# Patient Record
Sex: Female | Born: 1950 | Race: White | Hispanic: No | Marital: Married
Health system: Southern US, Community
[De-identification: ages and names within clinical notes are randomized; demographics above are authoritative.]

---

## 1998-03-11 ENCOUNTER — Other Ambulatory Visit: Admission: RE | Admit: 1998-03-11 | Discharge: 1998-03-11 | Payer: Self-pay | Admitting: *Deleted

## 1998-04-22 ENCOUNTER — Ambulatory Visit (HOSPITAL_COMMUNITY): Admission: RE | Admit: 1998-04-22 | Discharge: 1998-04-22 | Payer: Self-pay | Admitting: *Deleted

## 1999-05-01 ENCOUNTER — Other Ambulatory Visit: Admission: RE | Admit: 1999-05-01 | Discharge: 1999-05-01 | Payer: Self-pay | Admitting: Obstetrics & Gynecology

## 2000-04-27 ENCOUNTER — Other Ambulatory Visit: Admission: RE | Admit: 2000-04-27 | Discharge: 2000-04-27 | Payer: Self-pay | Admitting: Obstetrics & Gynecology

## 2000-04-28 ENCOUNTER — Other Ambulatory Visit: Admission: RE | Admit: 2000-04-28 | Discharge: 2000-04-28 | Payer: Self-pay | Admitting: Obstetrics & Gynecology

## 2002-01-09 ENCOUNTER — Ambulatory Visit (HOSPITAL_COMMUNITY): Admission: RE | Admit: 2002-01-09 | Discharge: 2002-01-09 | Payer: Self-pay | Admitting: Family Medicine

## 2002-01-09 ENCOUNTER — Encounter: Payer: Self-pay | Admitting: Family Medicine

## 2002-05-04 ENCOUNTER — Other Ambulatory Visit: Admission: RE | Admit: 2002-05-04 | Discharge: 2002-05-04 | Payer: Self-pay | Admitting: Obstetrics & Gynecology

## 2002-05-16 ENCOUNTER — Ambulatory Visit (HOSPITAL_COMMUNITY): Admission: RE | Admit: 2002-05-16 | Discharge: 2002-05-16 | Payer: Self-pay | Admitting: *Deleted

## 2002-05-16 ENCOUNTER — Encounter: Payer: Self-pay | Admitting: *Deleted

## 2002-06-14 ENCOUNTER — Ambulatory Visit (HOSPITAL_COMMUNITY): Admission: RE | Admit: 2002-06-14 | Discharge: 2002-06-14 | Payer: Self-pay | Admitting: Obstetrics & Gynecology

## 2002-06-14 ENCOUNTER — Encounter: Payer: Self-pay | Admitting: Obstetrics & Gynecology

## 2002-06-16 ENCOUNTER — Encounter: Admission: RE | Admit: 2002-06-16 | Discharge: 2002-06-16 | Payer: Self-pay | Admitting: Obstetrics & Gynecology

## 2002-06-16 ENCOUNTER — Encounter: Payer: Self-pay | Admitting: Obstetrics & Gynecology

## 2004-04-23 ENCOUNTER — Other Ambulatory Visit: Admission: RE | Admit: 2004-04-23 | Discharge: 2004-04-23 | Payer: Self-pay | Admitting: Obstetrics & Gynecology

## 2016-05-25 ENCOUNTER — Other Ambulatory Visit: Payer: Self-pay | Admitting: Family Medicine

## 2016-05-25 DIAGNOSIS — H5310 Unspecified subjective visual disturbances: Secondary | ICD-10-CM

## 2016-05-25 DIAGNOSIS — R519 Headache, unspecified: Secondary | ICD-10-CM

## 2016-05-25 DIAGNOSIS — R51 Headache: Principal | ICD-10-CM

## 2016-05-26 ENCOUNTER — Ambulatory Visit
Admission: RE | Admit: 2016-05-26 | Discharge: 2016-05-26 | Disposition: A | Discharge status: Home / Self Care | Payer: Medicare Other | Source: Ambulatory Visit | Attending: Family Medicine | Admitting: Family Medicine

## 2016-05-26 DIAGNOSIS — R51 Headache: Principal | ICD-10-CM

## 2016-05-26 DIAGNOSIS — R519 Headache, unspecified: Secondary | ICD-10-CM

## 2016-05-26 DIAGNOSIS — H5310 Unspecified subjective visual disturbances: Secondary | ICD-10-CM

## 2018-09-01 ENCOUNTER — Other Ambulatory Visit: Payer: Self-pay

## 2018-09-01 DIAGNOSIS — Z20822 Contact with and (suspected) exposure to covid-19: Secondary | ICD-10-CM

## 2018-09-02 LAB — NOVEL CORONAVIRUS, NAA: SARS-CoV-2, NAA: NOT DETECTED

## 2018-09-05 ENCOUNTER — Other Ambulatory Visit: Payer: Self-pay | Admitting: Family Medicine

## 2018-09-05 ENCOUNTER — Other Ambulatory Visit: Payer: Self-pay

## 2018-09-05 DIAGNOSIS — Z20822 Contact with and (suspected) exposure to covid-19: Secondary | ICD-10-CM

## 2018-09-05 DIAGNOSIS — R221 Localized swelling, mass and lump, neck: Secondary | ICD-10-CM

## 2018-09-06 LAB — NOVEL CORONAVIRUS, NAA: SARS-CoV-2, NAA: NOT DETECTED

## 2018-09-07 ENCOUNTER — Other Ambulatory Visit: Payer: Self-pay | Admitting: Family Medicine

## 2018-09-07 DIAGNOSIS — R222 Localized swelling, mass and lump, trunk: Secondary | ICD-10-CM

## 2018-09-08 ENCOUNTER — Ambulatory Visit
Admission: RE | Admit: 2018-09-08 | Discharge: 2018-09-08 | Disposition: A | Discharge status: Home / Self Care | Payer: Medicare Other | Source: Ambulatory Visit | Attending: Family Medicine | Admitting: Family Medicine

## 2018-09-08 DIAGNOSIS — R222 Localized swelling, mass and lump, trunk: Secondary | ICD-10-CM

## 2018-09-08 MED ORDER — IOPAMIDOL (ISOVUE-300) INJECTION 61%
75.0000 mL | Freq: Once | INTRAVENOUS | Status: AC | PRN
  Administered 2018-09-08: 75 mL via INTRAVENOUS

## 2018-09-12 ENCOUNTER — Other Ambulatory Visit: Payer: Self-pay | Admitting: Family Medicine

## 2018-09-12 DIAGNOSIS — R221 Localized swelling, mass and lump, neck: Secondary | ICD-10-CM

## 2019-01-02 ENCOUNTER — Other Ambulatory Visit: Payer: Self-pay | Admitting: Family Medicine

## 2019-01-02 DIAGNOSIS — Z1231 Encounter for screening mammogram for malignant neoplasm of breast: Secondary | ICD-10-CM

## 2019-01-03 ENCOUNTER — Ambulatory Visit
Admission: RE | Admit: 2019-01-03 | Discharge: 2019-01-03 | Disposition: A | Discharge status: Home / Self Care | Payer: Medicare Other | Source: Ambulatory Visit | Attending: Family Medicine | Admitting: Family Medicine

## 2019-01-03 ENCOUNTER — Other Ambulatory Visit: Payer: Self-pay

## 2019-01-03 DIAGNOSIS — Z1231 Encounter for screening mammogram for malignant neoplasm of breast: Secondary | ICD-10-CM

## 2019-01-04 ENCOUNTER — Other Ambulatory Visit: Payer: Self-pay | Admitting: Family Medicine

## 2019-01-04 DIAGNOSIS — N644 Mastodynia: Secondary | ICD-10-CM

## 2019-01-05 ENCOUNTER — Ambulatory Visit: Payer: Medicare Other

## 2019-01-05 ENCOUNTER — Other Ambulatory Visit: Payer: Self-pay

## 2019-01-05 ENCOUNTER — Ambulatory Visit
Admission: RE | Admit: 2019-01-05 | Discharge: 2019-01-05 | Disposition: A | Discharge status: Home / Self Care | Payer: Medicare Other | Source: Ambulatory Visit | Attending: Family Medicine | Admitting: Family Medicine

## 2019-01-05 DIAGNOSIS — N644 Mastodynia: Secondary | ICD-10-CM

## 2019-02-28 ENCOUNTER — Other Ambulatory Visit: Payer: Self-pay | Admitting: Cardiology

## 2019-02-28 DIAGNOSIS — Z20822 Contact with and (suspected) exposure to covid-19: Secondary | ICD-10-CM

## 2019-02-28 NOTE — Progress Notes (Signed)
covid

## 2019-03-01 LAB — NOVEL CORONAVIRUS, NAA: SARS-CoV-2, NAA: NOT DETECTED

## 2020-01-04 ENCOUNTER — Other Ambulatory Visit: Payer: Self-pay | Admitting: Family Medicine

## 2020-01-04 DIAGNOSIS — Z1231 Encounter for screening mammogram for malignant neoplasm of breast: Secondary | ICD-10-CM

## 2020-01-24 ENCOUNTER — Ambulatory Visit: Payer: Medicare Other

## 2021-06-26 IMAGING — MG DIGITAL DIAGNOSTIC BILAT W/ TOMO W/ CAD
8 series · 8 of 24 positions shown · non-contrast
Comparison: Previous exam(s).

CLINICAL DATA: Patient presents for annual exam as well as for new
diffuse pain in the superior aspect of the right breast that has
been going on for the last two weeks.

EXAM:
DIGITAL DIAGNOSTIC BILATERAL MAMMOGRAM WITH CAD AND TOMO

[L MLO synth-2D]
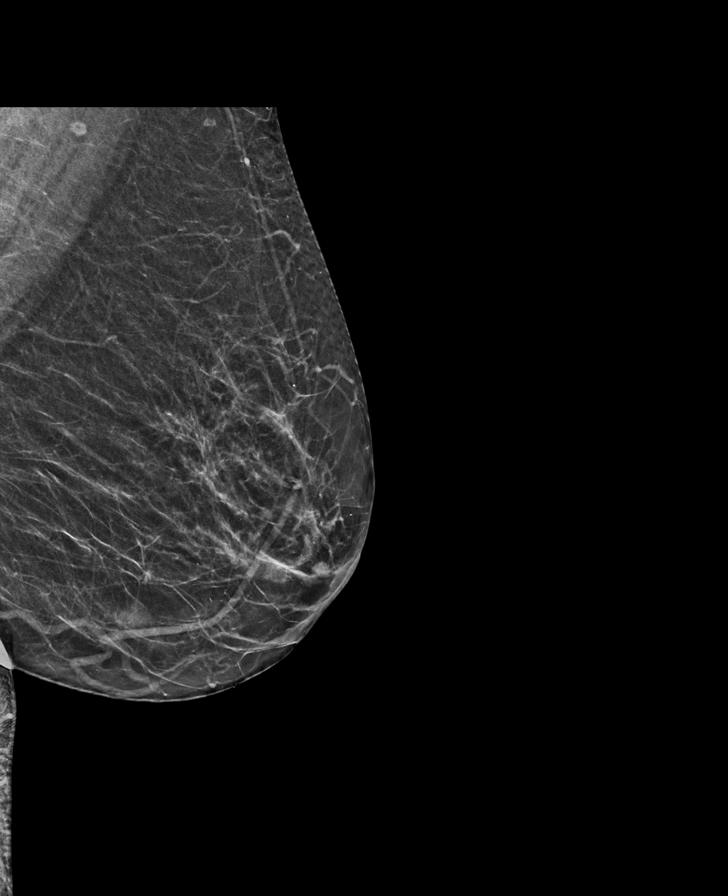

[R MLO synth-2D]
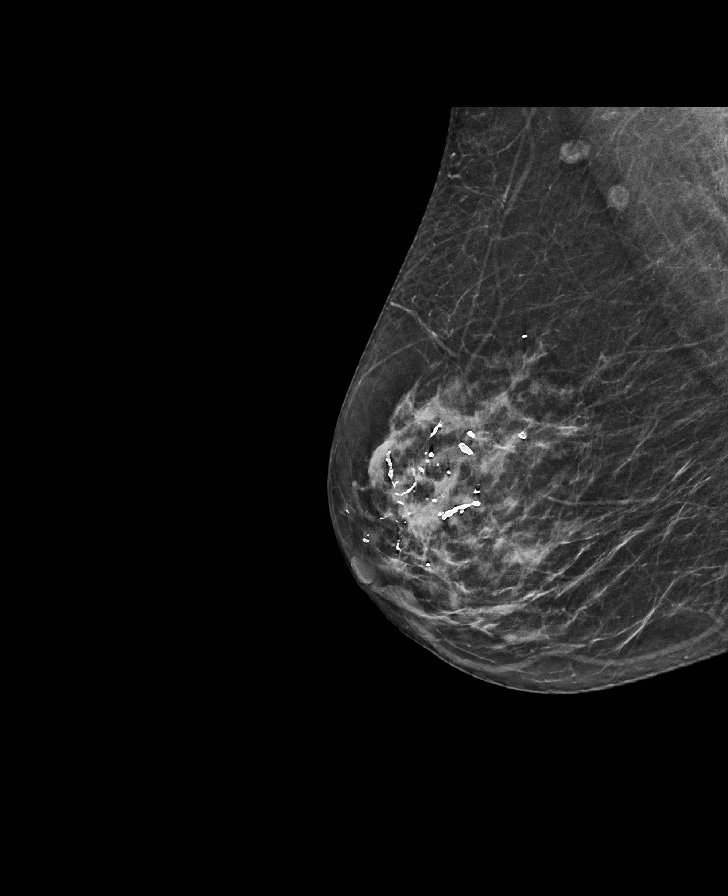

[L CC synth-2D]
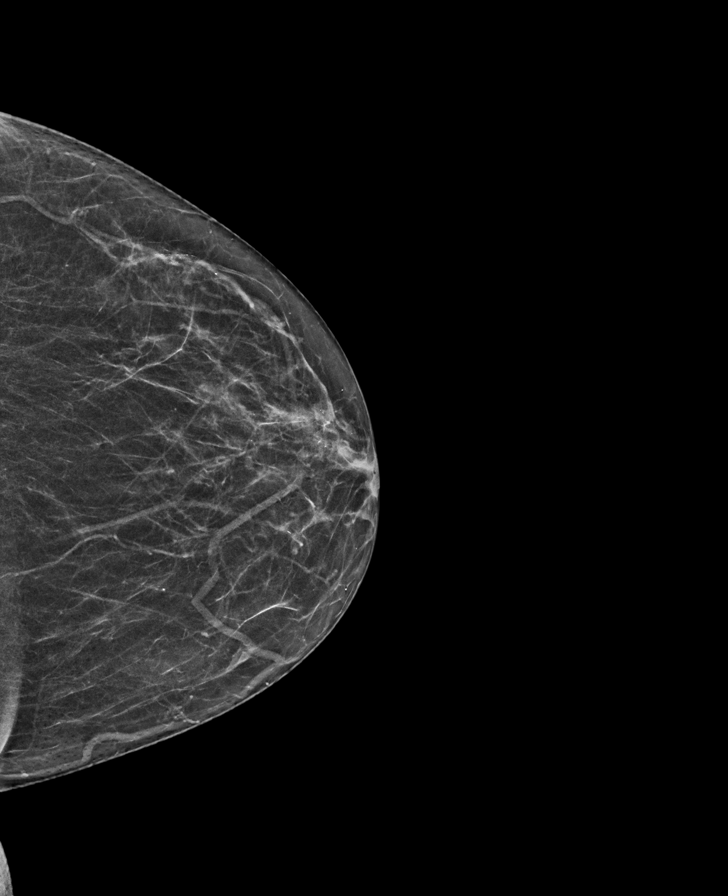

[R CC synth-2D]
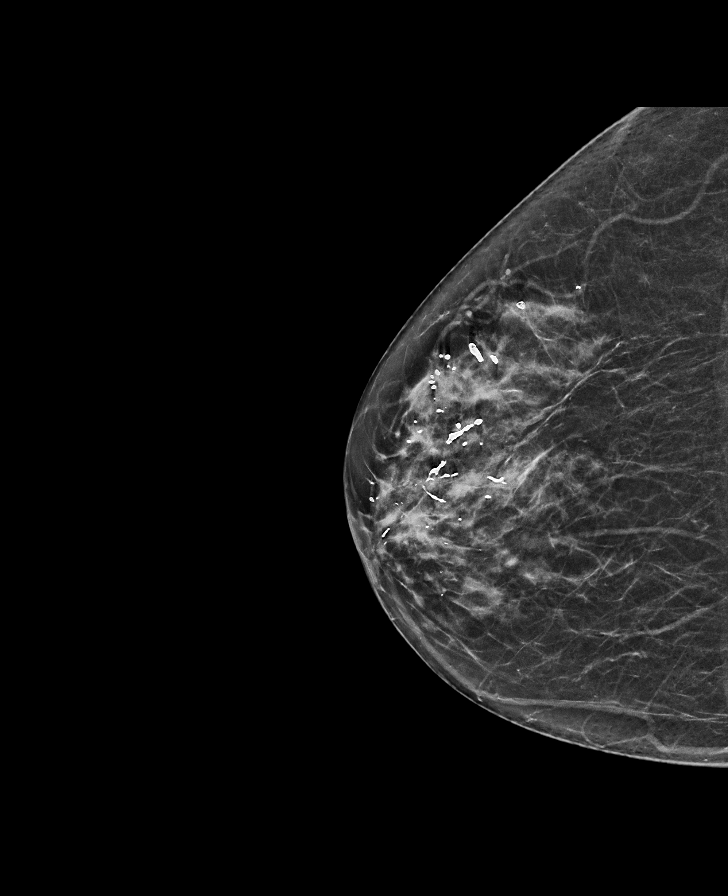

[L MLO tomo · tomo slice 30/59.0]
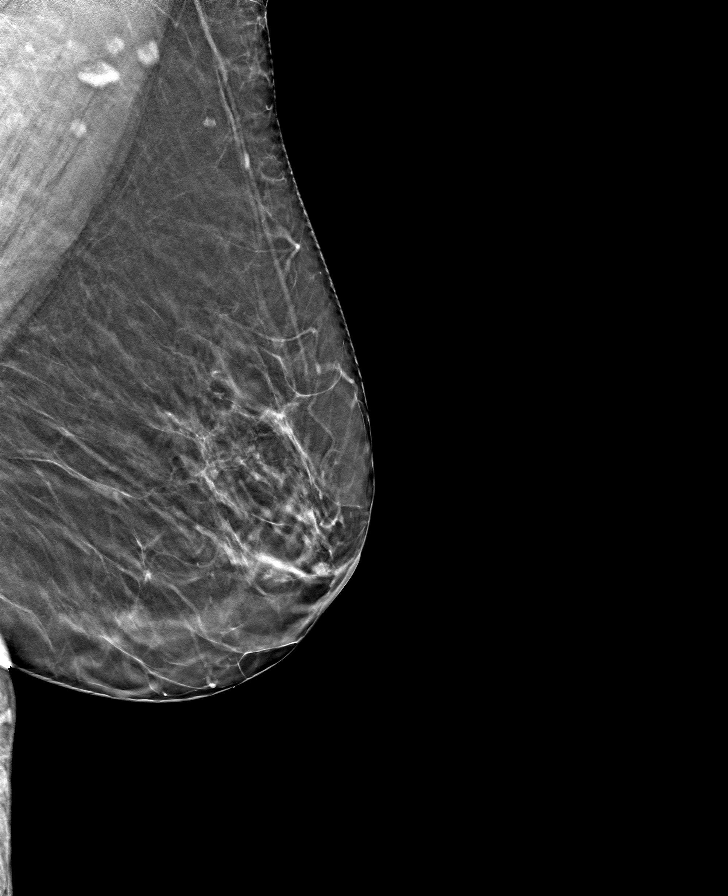

[L CC tomo · tomo slice 29/56.0]
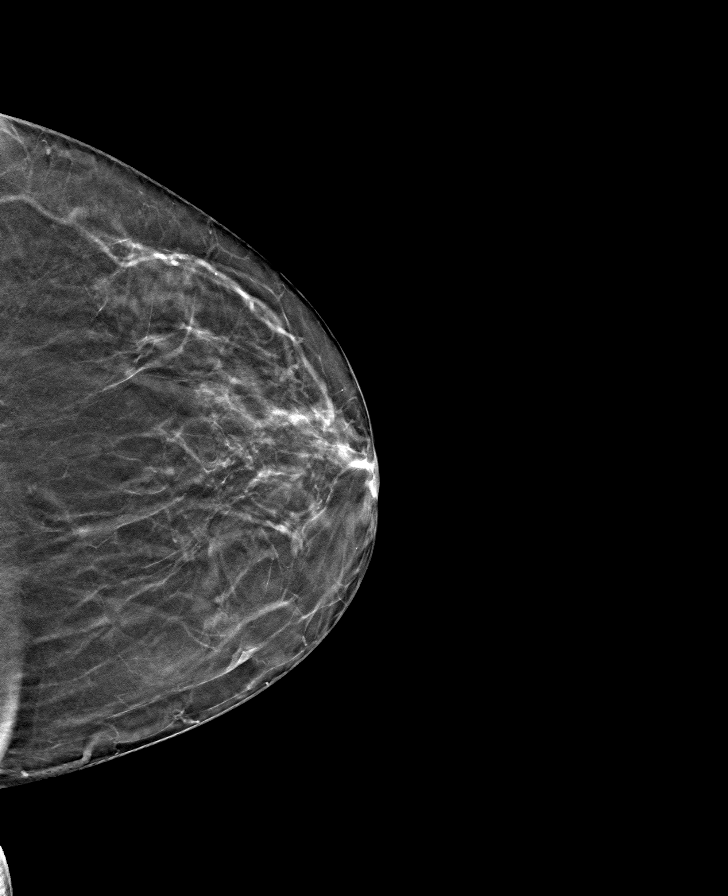

[R MLO tomo · tomo slice 31/61.0]
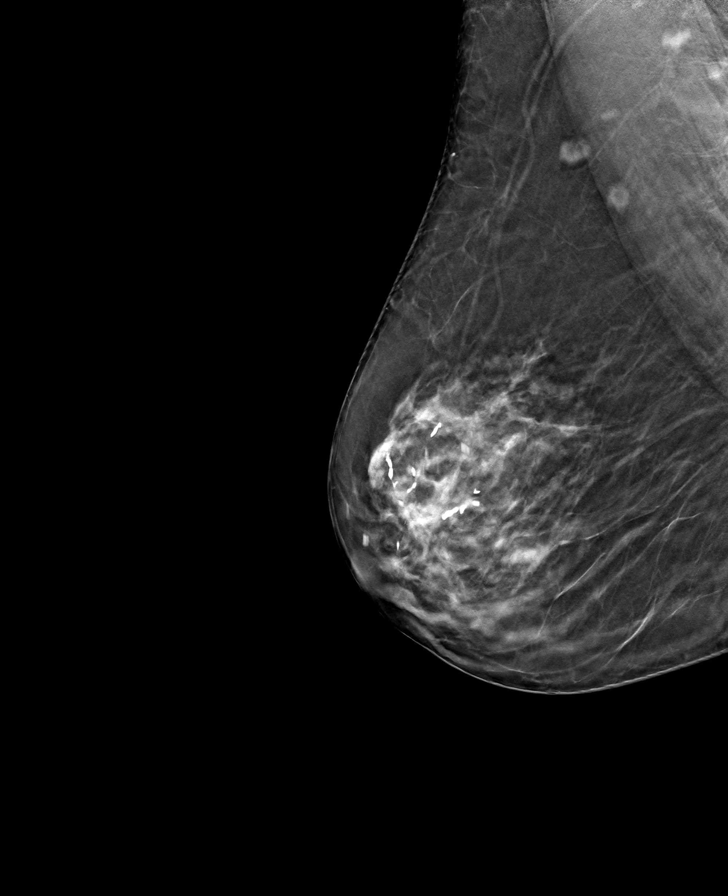

[R CC tomo · tomo slice 31/60.0]
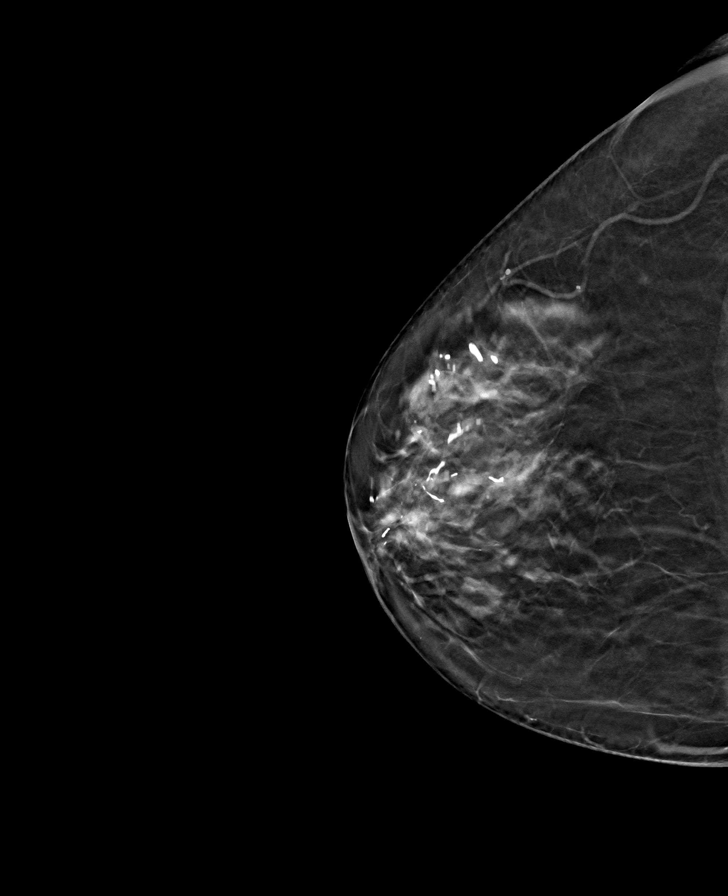

[8 of 24 positions shown; findings below may reference images not displayed]

ACR Breast Density Category c: The breast tissue is heterogeneously
dense, which may obscure small masses.
FINDINGS: No suspicious mass, distortion, or microcalcifications are
identified to suggest presence of malignancy in the bilateral
breasts. Specifically there is no finding in the superior aspect of
the right breast to explain the patient's diffuse pain.

Mammographic images were processed with CAD.
IMPRESSION: No mammographic evidence of malignancy in the bilateral breasts. No
finding to explain the patient's diffuse pain in the superior aspect
of the right breast.

RECOMMENDATION:
Screening mammogram in one year.(Code:IQ-[id number])

We discussed some of the issues regarding breast pain, including
limiting caffeine, wearing adequate support, over-the-counter pain
medication, low-fat diet, exercise, and ice as needed.

I have discussed the findings and recommendations with the patient.
If applicable, a reminder letter will be sent to the patient
regarding the next appointment.

BI-RADS CATEGORY  1: Negative.

## 2021-12-26 ENCOUNTER — Other Ambulatory Visit: Payer: Self-pay | Admitting: Family Medicine

## 2021-12-26 DIAGNOSIS — Z1231 Encounter for screening mammogram for malignant neoplasm of breast: Secondary | ICD-10-CM

## 2022-02-23 ENCOUNTER — Ambulatory Visit: Payer: Medicare Other
